# Patient Record
Sex: Female | Born: 1995
Health system: Southern US, Community
[De-identification: ages and names within clinical notes are randomized; demographics above are authoritative.]

## PROBLEM LIST (undated history)

## (undated) DIAGNOSIS — S53106A Unspecified dislocation of unspecified ulnohumeral joint, initial encounter: Secondary | ICD-10-CM

## (undated) DIAGNOSIS — Z91018 Allergy to other foods: Secondary | ICD-10-CM

## (undated) DIAGNOSIS — L309 Dermatitis, unspecified: Secondary | ICD-10-CM

## (undated) DIAGNOSIS — L500 Allergic urticaria: Secondary | ICD-10-CM

## (undated) HISTORY — DX: Unspecified dislocation of unspecified ulnohumeral joint, initial encounter: S53.106A

## (undated) HISTORY — DX: Dermatitis, unspecified: L30.9

## (undated) HISTORY — DX: Allergic urticaria: L50.0

## (undated) HISTORY — DX: Allergy to other foods: Z91.018

---
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
## 2017-08-31 ENCOUNTER — Telehealth: Payer: Self-pay | Admitting: Allergy and Immunology

## 2017-08-31 NOTE — Telephone Encounter (Signed)
Pt had appointment on 09/07/2017 but she wanted a skin test for seafood. You did a test a year ago and she was neg and she need to have a test because she had a reaction to shrimp or crab. Not sure which one so I put her down for 10/04/2017 at 8;30 for a challenge but which one you want her to have  [phone].

## 2017-08-31 NOTE — Telephone Encounter (Signed)
Skin test. Thanks.

## 2017-09-06 ENCOUNTER — Ambulatory Visit: Payer: Self-pay

## 2017-09-07 ENCOUNTER — Ambulatory Visit: Payer: Self-pay | Admitting: Allergy and Immunology

## 2017-09-08 ENCOUNTER — Ambulatory Visit (INDEPENDENT_AMBULATORY_CARE_PROVIDER_SITE_OTHER): Payer: BLUE CROSS/BLUE SHIELD | Admitting: Family Medicine

## 2017-09-08 ENCOUNTER — Encounter: Payer: Self-pay | Admitting: Family Medicine

## 2017-09-08 ENCOUNTER — Ambulatory Visit: Payer: Self-pay

## 2017-09-08 ENCOUNTER — Other Ambulatory Visit: Payer: Self-pay

## 2017-09-08 VITALS — BP 100/60 | HR 72 | Temp 98.7°F | Wt 145.0 lb

## 2017-09-08 DIAGNOSIS — Z30431 Encounter for routine checking of intrauterine contraceptive device: Secondary | ICD-10-CM

## 2017-09-08 DIAGNOSIS — IMO0001 Reserved for inherently not codable concepts without codable children: Secondary | ICD-10-CM

## 2017-09-08 NOTE — Progress Notes (Signed)
    Subjective:    Patient ID: Teresa Klein, female    DOB: 06/26/1995, 22 y.o.   MRN: [id number]   CC: IUD string check  HPI: reports she has not been seen in about a year and wants to make sure IUD is still in place. Denies seeing it come out. She denies pelvic pain. Does not get period on kyleena, just a few days of cramping every month. She's very pleased with it. Not currently sexually active, no concern for STDs. Going back to college tomorrow.   Smoking status reviewed- non-smoker  Review of Systems- see HPI   Objective:  BP 100/60   Pulse 72   Temp 98.7 F (37.1 C) (Oral)   Wt 145 lb (65.8 kg)   SpO2 98%   BMI 26.52 kg/m  Vitals and nursing note reviewed  General: well nourished, in no acute distress HEENT: normocephalic, MMM Cardiac: RRR, clear S1 and S2, no murmurs, rubs, or gallops Respiratory: clear to auscultation bilaterally, no increased work of breathing GU: normal external female genitalia, IUD strings visible from cervical os, cervix pink without lesions, no CMT or adnexal masses appreciated Extremities: no edema or cyanosis. Skin: warm and dry, no rashes noted Neuro: alert and oriented, no focal deficits   Assessment & Plan:   1. IUD check up Strings visualized in place  2. Contraception Kyleena placed May 2018, due to come out May 2023  3. Healthcare maintenance -due for tdap 2020 -due for HIV screening, patient defers at this time- not sexually active   Return in about 1 year (around 09/09/2018), or as needed.   Dolores PattyAngela Kortney Schoenfelder, DO Family Medicine Resident PGY-3

## 2017-09-08 NOTE — Patient Instructions (Signed)
It was great seeing you today. Enjoy your semester and if you have any health needs we're happy to see you any time.  If you have questions or concerns please do not hesitate to call at [phone].  Teresa Maine, DO PGY-3, [facility] 09/08/2017 2:02 PM    Preventive Care for Young Adults, Female The transition to life after high school as a young adult can be a stressful time with many changes. You may start seeing a primary care physician instead of a pediatrician. This is the time when your health care becomes your responsibility. Preventive care refers to lifestyle choices and visits with your health care provider that can promote health and wellness. What does preventive care include?  A yearly physical exam. This is also called an annual wellness visit.  Dental exams once or twice a year.  Routine eye exams. Ask your health care provider how often you should have your eyes checked.  Personal lifestyle choices, including: ? Daily care of your teeth and gums. ? Regular physical activity. ? Eating a healthy diet. ? Avoiding tobacco and drug use. ? Avoiding or limiting alcohol use. ? Practicing safe sex. ? Taking vitamin and mineral supplements as recommended by your health care provider. What happens during an annual wellness visit? Preventive care starts with a yearly visit to your primary care physician. The services and screenings done by your health care provider during your annual wellness visit will depend on your overall health, lifestyle risk factors, and family history of disease. Counseling Your health care provider may ask you questions about:  Past medical problems and your family's medical history.  Medicines or supplements you take.  Health insurance and access to health care.  Alcohol, tobacco, and drug use.  Your safety at home, work, or school.  Access to firearms.  Emotional well-being and how you cope with  stress.  Relationship well-being.  Diet, exercise, and sleep habits.  Your sexual health and activity.  Your methods of birth control.  Your menstrual cycle.  Your pregnancy history.  Screening You may have the following tests or measurements:  Height, weight, and BMI.  Blood pressure.  Lipid and cholesterol levels.  Tuberculosis skin test.  Skin exam.  Vision and hearing tests.  Screening test for hepatitis.  Screening tests for sexually transmitted diseases (STDs), if you are at risk.  BRCA-related cancer screening. This may be done if you have a family history of breast, ovarian, tubal, or peritoneal cancers.  Pelvic exam and Pap test. This may be done every 3 years starting at age 61.  Vaccines Your health care provider may recommend certain vaccines, such as:  Influenza vaccine. This is recommended every year.  Tetanus, diphtheria, and acellular pertussis (Tdap, Td) vaccine. You may need a Td booster every 10 years.  Varicella vaccine. You may need this if you have not been vaccinated.  HPV vaccine. If you are 65 or younger, you may need three doses over 6 months.  Measles, mumps, and rubella (MMR) vaccine. You may need at least one dose of MMR. You may also need a second dose.  Pneumococcal 13-valent conjugate (PCV13) vaccine. You may need this if you have certain conditions and were not previously vaccinated.  Pneumococcal polysaccharide (PPSV23) vaccine. You may need one or two doses if you smoke cigarettes or if you have certain conditions.  Meningococcal vaccine. One dose is recommended if you are age 43-21 years and a Market researcher living in a residence hall,  or if you have one of several medical conditions. You may also need additional booster doses.  Hepatitis A vaccine. You may need this if you have certain conditions or if you travel or work in places where you may be exposed to hepatitis A.  Hepatitis B vaccine. You may need this  if you have certain conditions or if you travel or work in places where you may be exposed to hepatitis B.  Haemophilus influenzae type b (Hib) vaccine. You may need this if you have certain risk factors.  Talk to your health care provider about which screenings and vaccines you need and how often you need them. What steps can I take to develop healthy behaviors?  Have regular preventive health care visits with your primary care physician and dentist.  Eat a healthy diet.  Drink enough fluid to keep your urine clear or pale yellow.  Stay active. Exercise at least 30 minutes 5 or more days of the week.  Use alcohol responsibly.  Maintain a healthy weight.  Do not use any products that contain nicotine, such as cigarettes, chewing tobacco, and e-cigarettes. If you need help quitting, ask your health care provider.  Do not use drugs.  Practice safe sex.  Use birth control (contraception) to prevent unwanted pregnancy. If you plan to become pregnant, see your health care provider for a pre-conception visit.  Find healthy ways to manage stress. How can I protect myself from injury? Injuries from violence or accidents are the leading cause of death among young adults and can often be prevented. Take these steps to help protect yourself:  Always wear your seat belt while driving or riding in a vehicle.  Do not drive if you have been drinking alcohol. Do not ride with someone who has been drinking.  Do not drive when you are tired or distracted. Do not text while driving.  Wear a helmet and other protective equipment during sports activities.  If you have firearms in your house, make sure you follow all gun safety procedures.  Seek help if you have been bullied, physically abused, or sexually abused.  Use the Internet responsibly to avoid dangers such as online bullying and online sexual predators.  What can I do to cope with stress? Young adults may face many new challenges  that can be stressful, such as finding a job, going to college, moving away from home, managing money, being in a relationship, getting married, and having children. To manage stress:  Avoid known stressful situations when you can.  Exercise regularly.  Find a stress-reducing activity that works best for you. Examples include meditation, yoga, listening to music, or reading.  Spend time in nature.  Keep a journal to write about your stress and how you respond.  Talk to your health care provider about stress. He or she may suggest counseling.  Spend time with supportive friends or family.  Do not cope with stress by: ? Drinking alcohol or using drugs. ? Smoking cigarettes. ? Eating.  Where can I get more information? Learn more about preventive care and healthy habits from:  [place] and Gynecologists: KaraokeExchange.nl  U.S. Probation officer Task Force: StageSync.si  National Adolescent and [place]: StrategicRoad.nl  American Academy of Pediatrics Bright Futures: https://brightfutures.MemberVerification.co.za  Society for Adolescent Health and Medicine: MoralBlog.co.za.aspx  PodExchange.nl: ToyLending.fr  This information is not intended to replace advice given to you by your health care provider. Make sure you discuss any questions you have with  your health care provider. Document Released: 05/30/2015 Document Revised: 06/20/2015 Document Reviewed: 05/30/2015 Elsevier Interactive Patient Education  [employer].

## 2017-10-04 ENCOUNTER — Encounter: Payer: Self-pay | Admitting: Allergy and Immunology

## 2017-10-04 ENCOUNTER — Ambulatory Visit: Payer: Self-pay | Admitting: Allergy and Immunology

## 2017-11-29 ENCOUNTER — Encounter: Payer: Medicaid Other | Admitting: Family Medicine

## 2017-12-07 ENCOUNTER — Encounter: Payer: Self-pay | Admitting: Family Medicine

## 2017-12-07 ENCOUNTER — Ambulatory Visit (INDEPENDENT_AMBULATORY_CARE_PROVIDER_SITE_OTHER): Payer: BLUE CROSS/BLUE SHIELD | Admitting: Family Medicine

## 2017-12-07 ENCOUNTER — Other Ambulatory Visit: Payer: Self-pay

## 2017-12-07 VITALS — BP 100/58 | HR 74 | Temp 99.0°F | Ht 62.0 in | Wt 147.8 lb

## 2017-12-07 DIAGNOSIS — Z0000 Encounter for general adult medical examination without abnormal findings: Secondary | ICD-10-CM | POA: Diagnosis not present

## 2017-12-07 DIAGNOSIS — J301 Allergic rhinitis due to pollen: Secondary | ICD-10-CM

## 2017-12-07 MED ORDER — CETIRIZINE HCL 10 MG PO TABS: 10.0000 mg | ORAL_TABLET | Freq: Every day | ORAL | 11 refills | Status: AC

## 2017-12-07 MED ORDER — FLUTICASONE PROPIONATE 50 MCG/ACT NA SUSP
2.0000 | Freq: Every day | NASAL | 3 refills | Status: AC
Start: 2017-12-07 — End: 2018-01-06

## 2017-12-07 NOTE — Progress Notes (Signed)
Teresa Klein is a 22 y.o. female who presents to [facility] today for a comprehensive physical examination:  CPE  Concerns:  None Traveling to [place] this semester and needs a physical form completed.  Has history of allergies to shellfish -- she is going for allergy testing in December.  Otherwise no concerns.   Last physical last year Tetanus up to date Flu vaccine yes Eye exam:  n/a Dental exam every six months.   PMH reviewed. Patient is a nonsmoker.   Past Medical History:  Diagnosis Date  . Closed unspecified dislocation of elbow 01/13/2010   Qualifier: Diagnosis of  By: Jennette Kettle MD, [place] Dec    . Eczema   . Urticaria 05/18/2016   Past Surgical History:  Procedure Laterality Date  . WISDOM TOOTH EXTRACTION  2018    Medications reviewed. Current Outpatient Medications  Medication Sig Dispense Refill  . cetirizine (ZYRTEC) 10 MG tablet Take 1 tablet (10 mg total) by mouth daily. 30 tablet 11  . fluticasone (FLONASE) 50 MCG/ACT nasal spray Place 2 sprays into both nostrils daily. 16 g 3   No current facility-administered medications for this visit.     Social: Smoking history:  denies Alcohol use:  occasional Illicit drug use:  denies Relationship status:   single Occupation:  Consulting civil engineer at [employer]  Family History:  Father with HTN, HLD, DM.  Mom with allergic rhinitis.    Review of Systems  Constitutional: Negative for fever.  HENT: Negative for congestion, ear discharge, ear pain and hearing loss.   Eyes: Negative for blurred vision.  Respiratory: Negative for cough and wheezing.   Cardiovascular: Negative for chest pain, palpitations and leg swelling.  Gastrointestinal: Negative for nausea, vomiting and abdominal pain.  Genitourinary: Negative for dysuria, hematuria and flank pain.  Musculoskeletal: Negative for neck pain.  Skin: Negative for rash.  Neurological: Negative for dizziness and headaches.  Psychiatric/Behavioral: Negative for depression  and suicidal ideas.   Exam: BP (!) 100/58   Pulse 74   Temp 99 F (37.2 C) (Oral)   Ht 5\' 2"  (1.575 m)   Wt 147 lb 12.8 oz (67 kg)   SpO2 99%   BMI 27.03 kg/m  Gen:  Alert, cooperative patient who appears stated age in no acute distress.  Vital signs reviewed. Head: Delta/AT.   Eyes:  EOMI, PERRL.   Ears:  External ears WNL, Bilateral TM's normal without retraction, redness or bulging. Nose:  Septum midline  Mouth:  MMM, tonsils non-erythematous, non-edematous.   Neck: No masses or thyromegaly or limitation in range of motion.  No cervical lymphadenopathy. Pulm:  Clear to auscultation bilaterally with good air movement.  No wheezes or rales noted.   Cardiac:  Regular rate and rhythm without murmur auscultated.  Good S1/S2. Abd:  Soft/nondistended/nontender.  Good bowel sounds throughout all four quadrants.  No masses noted.  Ext:  No clubbing/cyanosis/erythema.  No edema noted bilateral lower extremities.   Neuro:  Grossly normal, no gait abnormalities Psych:  Not depressed or anxious appearing.  Conversant and engaged  Impression/Plan: 1. Complete Physical Examination: anticipatory guidance provided.  S/p TDAP.   2.  Allergies:  Continue cetirizine and flonase for relief.  Keep appt with allergiest. 3.  No concerns by history or exam.  I completed her paperwork for study abroad.

## 2017-12-07 NOTE — Patient Instructions (Signed)
It was good to meet you today.  Best of luck in [place]!

## 2017-12-14 ENCOUNTER — Telehealth: Payer: Self-pay

## 2017-12-14 NOTE — Telephone Encounter (Signed)
She last had a reaction on memorial day of 2019. On that day she was eating crab and shrimp. She has not consumed it since.

## 2017-12-14 NOTE — Telephone Encounter (Signed)
Spoke to patient advised as written per Dr Nunzio CobbsBobbitt explained to patient a skin test is needed first and possible labwork. Patient verbalized understanding. appt scheduled for skin testing 12/22/17 @ 9:30 am

## 2017-12-14 NOTE — Telephone Encounter (Signed)
Last year she had borderline positive skin tests to shellfish but had stated that she ate shellfish on a regular basis without problems, so it was considered false positive results. Has she had problems with shellfish since that time?

## 2017-12-14 NOTE — Telephone Encounter (Signed)
Called patient left message to return call

## 2017-12-14 NOTE — Telephone Encounter (Signed)
Ok, then we definitely need to start with skin testing. Thanks.

## 2017-12-14 NOTE — Telephone Encounter (Signed)
Hey patient was down for a shrimp challenge, but I see in the previous telephone contact that you stated to do the allergy test. I am wondering which should I do. I think the patient is under the impression that it will be a challenge. She is going out of the country in January with her school and she needs to know if she can eat shrimp or not.  Thanks

## 2017-12-22 ENCOUNTER — Ambulatory Visit: Payer: BLUE CROSS/BLUE SHIELD | Admitting: Family Medicine

## 2017-12-22 ENCOUNTER — Encounter: Payer: Self-pay | Admitting: Family Medicine

## 2017-12-22 VITALS — BP 86/60 | HR 64 | Temp 98.3°F | Resp 16 | Ht 62.0 in | Wt 145.7 lb

## 2017-12-22 DIAGNOSIS — T7800XD Anaphylactic reaction due to unspecified food, subsequent encounter: Secondary | ICD-10-CM | POA: Diagnosis not present

## 2017-12-22 DIAGNOSIS — J3089 Other allergic rhinitis: Secondary | ICD-10-CM

## 2017-12-22 DIAGNOSIS — H1013 Acute atopic conjunctivitis, bilateral: Secondary | ICD-10-CM

## 2017-12-22 MED ORDER — EPINEPHRINE 0.3 MG/0.3ML IJ SOAJ: INTRAMUSCULAR | 2 refills | Status: AC

## 2017-12-22 MED ORDER — OLOPATADINE HCL 0.2 % OP SOLN: OPHTHALMIC | 5 refills | Status: AC

## 2017-12-22 NOTE — Patient Instructions (Addendum)
Food allergy Your skin testing today was positive to shellfish mix, shrimp, carb, and lobster Continue to avoid all shellfish. In case of an allergic reaction, take Benadryl  50 mg every 4  hours, and if life-threatening symptoms occur, inject with AuviQ 0.3 mg.  Perennial and seasonal allergic rhinitis Continue cetirizine 10 mg once a day as needed for a runny nose Continue Flonase 1-2 sprays in each nostril once a day as needed for a stuffy nose Consider nasal saline rinses as needed for nasal symptoms  Allergic conjunctivitis of both eyes Pataday eye drops one drop in each eye once a day as needed for red or itchy eyes  Call us if this treatment plan is not working well for you  Follow up in 4 months or sooner if needed

## 2017-12-22 NOTE — Progress Notes (Addendum)
[address] [place] [id number] Dept: [phone]  FOLLOW UP NOTE  Patient ID: Teresa Klein, female    DOB: 1995/03/14  Age: 22 y.o. MRN: [id number] Date of Office Visit: 12/22/2017  Assessment  Chief Complaint: Allergy Testing  HPI Teresa Klein is a 22 year old female who presents to the clinic for a follow up visit. She was last seen in this office on 05/18/2016 by Dr. Nunzio CobbsBobbitt for evaluation of allergic rhinitis, allergic conjunctivitis, urticaria, and food allergies. At that time percutaneous skin testing was equivocal to shellfish mix and shrimp. However, he had stated at that time that she consumed shellfish on a regular basis without symptoms, therefore the skin test was considered to be a false positive result.  At today's visit, she reports that in January 2019 she experienced mouth and throat itching, eye swelling, dizziness, and hand swelling and itching immediately after eating shrimp. She took Benadryl and her symptoms were gone in the morning when she woke up. She experienced the same symptoms [place] [facility] [place] [facility] which were resolved with Benadryl by the next morning. She has not experienced swelling or hives other than when she had eaten shellfish since her last visit to this office. She is currently avoiding shellfish. Allergic rhinitis is reported as well controlled with cetirizine and Flonase as needed. Her current medications are listed in the chart.   Drug Allergies:  Allergies  Allergen Reactions  . Other     pitaya causes facial swelling  . Shrimp [Shellfish Allergy]     Facial,hand,and toes swelling. Itchy ears and eyes    Physical Exam: BP (!) 86/60 (BP Location: Left Arm, Patient Position: Sitting, Cuff Size: Normal)   Pulse 64   Temp 98.3 F (36.8 C) (Oral)   Resp 16   Ht 5\' 2"  (1.575 m)   Wt 145 lb 11.6 oz (66.1 kg)   SpO2 99%   BMI 26.65 kg/m    Physical Exam  Constitutional: She is oriented to person, place,  and time. She appears well-developed and well-nourished.  HENT:  Head: Normocephalic.  Right Ear: External ear normal.  Left Ear: External ear normal.  Nose: Nose normal.  Mouth/Throat: Oropharynx is clear and moist.  Bilateral nares normal. Pharynx normal. Ears normal. Eyes normal.  Eyes: Conjunctivae are normal.  Neck: Normal range of motion. Neck supple.  Cardiovascular: Normal rate, regular rhythm and normal heart sounds.  No murmur noted  Pulmonary/Chest: Effort normal and breath sounds normal.  Lungs clear to auscultation  Musculoskeletal: Normal range of motion.  Neurological: She is alert and oriented to person, place, and time.  Skin: Skin is warm and dry.  Psychiatric: She has a normal mood and affect. Her behavior is normal. Judgment and thought content normal.  Vitals reviewed.   Diagnostics: Percutaneous skin testing positive to shellfish mix, shrimp, crab, and lobster and negative to oyster and scallop with adequate controls.   Assessment and Plan: 1. Anaphylactic shock due to food, subsequent encounter   2. Perennial and seasonal allergic rhinitis   3. Allergic conjunctivitis of both eyes     Meds ordered this encounter  Medications  . Olopatadine HCl (PATADAY) 0.2 % SOLN    Sig: One drop each eye once a day as needed for itchy eyes.    Dispense:  2.5 mL    Refill:  5  . EPINEPHrine (AUVI-Q) 0.3 mg/0.3 mL IJ SOAJ injection    Sig: Use as directed for severe allergic  reaction.    Dispense:  4 Device    Refill:  2    Dispense 2 cartons of 2 devices each.    Patient Instructions  Food allergy The patient's history suggests shellfish allergy and positive skin test results today confirm this diagnosis.  Meticulous avoidance of shellfish as discussed.  A prescription has been provided for epinephrine auto-injector 2 pack along with instructions for proper administration.  A food allergy action plan has been provided and discussed.  Medic Alert  identification is recommended.  Perennial and seasonal allergic rhinitis Continue cetirizine 10 mg once a day as needed for a runny nose Continue Flonase 1-2 sprays in each nostril once a day as needed for a stuffy nose Consider nasal saline rinses as needed for nasal symptoms  Allergic conjunctivitis of both eyes Pataday eye drops one drop in each eye once a day as needed for red or itchy eyes  Call us if this treatment plan is not working well for you  Follow up in 4 months or sooner if needed   Return in about 4 months (around 04/22/2018).    Thank you for the opportunity to care for this patient.  Please do not hesitate to contact me with questions.  Thermon Leyland, FNP [facility]  _________________________________________________  I have provided oversight concerning [facility]'s evaluation and treatment of this patient's health issues addressed during today's encounter.  I agree with the assessment and therapeutic plan as outlined in the note.   Signed,   R Jorene Guest, MD

## 2018-01-10 ENCOUNTER — Ambulatory Visit: Payer: Medicaid Other | Admitting: Allergy and Immunology

## 2018-01-12 ENCOUNTER — Encounter: Payer: Medicaid Other | Admitting: Family Medicine

## 2018-01-12 ENCOUNTER — Ambulatory Visit (INDEPENDENT_AMBULATORY_CARE_PROVIDER_SITE_OTHER): Payer: BLUE CROSS/BLUE SHIELD

## 2018-01-12 DIAGNOSIS — Z111 Encounter for screening for respiratory tuberculosis: Secondary | ICD-10-CM

## 2018-01-12 NOTE — Progress Notes (Signed)
   Tuberculin skin test applied to right ventral forearm. Appointment made for 01/14/18 for PPD reading.  Ples SpecterAlisa Rafaelita Foister, RN [facility] RN)

## 2018-01-14 ENCOUNTER — Ambulatory Visit (INDEPENDENT_AMBULATORY_CARE_PROVIDER_SITE_OTHER): Payer: BLUE CROSS/BLUE SHIELD

## 2018-01-14 DIAGNOSIS — Z111 Encounter for screening for respiratory tuberculosis: Secondary | ICD-10-CM

## 2018-01-14 LAB — TB SKIN TEST
INDURATION: 0 mm
TB Skin Test: NEGATIVE

## 2018-01-14 NOTE — Progress Notes (Signed)
   Patient here today to have PPD site read.   PPD read and results entered in Epic. Result: 0 mm induration. Interpretation: Negative Letter given for employer.  Alisa Brake, RN ([facility] RN) 

## 2019-02-03 ENCOUNTER — Ambulatory Visit: Payer: 59 | Attending: Internal Medicine

## 2019-02-03 DIAGNOSIS — Z20822 Contact with and (suspected) exposure to covid-19: Secondary | ICD-10-CM

## 2019-02-04 LAB — NOVEL CORONAVIRUS, NAA: SARS-CoV-2, NAA: NOT DETECTED

## 2019-02-20 ENCOUNTER — Ambulatory Visit: Payer: 59 | Admitting: Family Medicine

## 2019-03-17 ENCOUNTER — Other Ambulatory Visit: Payer: Self-pay

## 2019-03-17 ENCOUNTER — Ambulatory Visit: Payer: 59 | Admitting: Family Medicine

## 2019-03-31 ENCOUNTER — Other Ambulatory Visit (HOSPITAL_COMMUNITY)
Admission: RE | Admit: 2019-03-31 | Discharge: 2019-03-31 | Disposition: A | Discharge status: Home / Self Care | Payer: 59 | Source: Ambulatory Visit | Attending: Family Medicine | Admitting: Family Medicine

## 2019-03-31 ENCOUNTER — Other Ambulatory Visit: Payer: Self-pay

## 2019-03-31 ENCOUNTER — Ambulatory Visit (INDEPENDENT_AMBULATORY_CARE_PROVIDER_SITE_OTHER): Payer: 59 | Admitting: Family Medicine

## 2019-03-31 ENCOUNTER — Encounter: Payer: Self-pay | Admitting: Family Medicine

## 2019-03-31 VITALS — BP 98/72 | HR 64 | Wt 136.4 lb

## 2019-03-31 DIAGNOSIS — Z0000 Encounter for general adult medical examination without abnormal findings: Secondary | ICD-10-CM | POA: Insufficient documentation

## 2019-03-31 NOTE — Progress Notes (Signed)
    SUBJECTIVE:   CHIEF COMPLAINT / HPI:   Ms. Teresa Klein is a 24 yo female presenting today to reestablish care and for an annual check up.  She does not have any concerns today. She was seen in our clinic a couple of years ago and change of insurance has brought her back to our office "where she belongs". She is studying at [employer]	[place] with a pre-med focus. 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 Her mother is a Research officer, trade union for [employer].  She is not currently sexually active but has been in the past with one female partner. She has an IUD place 06/23/2016 and is very happy with it. She initially had regular periods that have since become lighter and intermittent. She does not want STI testing at this time. She does not ingestion alcohol and has never used tobacco or illicit drugs.    She does not endorse vaginal irritation, dysuria, or changes in bowels.  PERTINENT  PMH / PSH: Allergic rhinitis, GERD, functional bowel disease, Acne, GAD, IUD.   OBJECTIVE:   Wt 136 lb 6.4 oz (61.9 kg)   BMI 24.95 kg/m     Office Visit from 03/31/2019 in [facility]  PHQ-2 Total Score  0     General: Appears well, no acute distress. Age appropriate. Cardiac: RRR, normal heart sounds, no murmurs Respiratory: CTAB, normal effort Abdomen: soft, nontender, non distended, +BS Pelvic: cervix normal in appearance, external genitalia normal, no adnexal masses or tenderness, no cervical motion tenderness, uterus normal size, shape, and consistency and vagina normal without discharge Extremities: No edema or cyanosis. Skin: Warm and dry, no rashes noted Psych: normal affect  ASSESSMENT/PLAN:   Annual physical exam Reestablishing care. Was last seen in our clinic 1 year ago. No concerns today. Has been sexually active in the past with 1 female partner using condoms. Does not want STI testing but amendable to 1x HIV  screening. Desires Pap today.  -Pap smear -HIV antibody test -Consider STI testing at future visits -Follow up in 1-2 years or sooner if needed  Lavonda Jumbo, DO [facility]

## 2019-03-31 NOTE — Patient Instructions (Addendum)
It was very nice to meet you today. Please enjoy the rest of your week. Good luck in school, study hard!   We will call you with your results.  Return in 1-2 years for annual wellness exam or sooner if needed.  Please call the clinic at [phone] if you have any concerns. It was our pleasure to serve you.

## 2019-03-31 NOTE — Assessment & Plan Note (Addendum)
Reestablishing care. Was last seen in our clinic 1 year ago. No concerns today. Has been sexually active in the past with 1 female partner using condoms. Does not want STI testing but amendable to 1x HIV screening. Desires Pap today.  -Pap smear -HIV antibody test -Consider STI testing at future visits -Follow up in 1-2 years or sooner if needed

## 2019-04-01 LAB — HIV ANTIBODY (ROUTINE TESTING W REFLEX): HIV Screen 4th Generation wRfx: NONREACTIVE

## 2019-04-03 LAB — CYTOLOGY - PAP: Diagnosis: NEGATIVE

## 2019-08-21 ENCOUNTER — Encounter: Payer: Self-pay | Admitting: Family Medicine

## 2019-08-21 ENCOUNTER — Other Ambulatory Visit: Payer: Self-pay

## 2019-08-21 ENCOUNTER — Ambulatory Visit: Payer: 59 | Admitting: Family Medicine

## 2019-08-21 VITALS — BP 94/60 | HR 60 | Ht 62.0 in | Wt 127.0 lb

## 2019-08-21 DIAGNOSIS — N941 Unspecified dyspareunia: Secondary | ICD-10-CM

## 2019-08-21 DIAGNOSIS — Z1329 Encounter for screening for other suspected endocrine disorder: Secondary | ICD-10-CM

## 2019-08-21 DIAGNOSIS — B9689 Other specified bacterial agents as the cause of diseases classified elsewhere: Secondary | ICD-10-CM

## 2019-08-21 DIAGNOSIS — Z130 Encounter for screening for diseases of the blood and blood-forming organs and certain disorders involving the immune mechanism: Secondary | ICD-10-CM

## 2019-08-21 DIAGNOSIS — L089 Local infection of the skin and subcutaneous tissue, unspecified: Secondary | ICD-10-CM | POA: Diagnosis not present

## 2019-08-21 DIAGNOSIS — F0630 Mood disorder due to known physiological condition, unspecified: Secondary | ICD-10-CM

## 2019-08-21 LAB — POCT WET PREP (WET MOUNT)
Clue Cells Wet Prep Whiff POC: NEGATIVE
Trichomonas Wet Prep HPF POC: ABSENT

## 2019-08-21 MED ORDER — MUPIROCIN 2 % EX OINT: 1.0000 "application " | TOPICAL_OINTMENT | Freq: Two times a day (BID) | CUTANEOUS | 0 refills | Status: AC

## 2019-08-21 NOTE — Assessment & Plan Note (Signed)
Patient has h/o GAD and MDD, currently being treated by psychiatrist with escitalopram and recently decreasing dosage, likely unmasking development of affective and physical symptoms of pre-menstrual syndrome or possibly PMDD. Patient feels very low mood, notices mood swings, bloating, intense fatigue, very similar to her previous depressive symptoms that start the week before her period and completely resolve by day 2 of her period for the last 2 months. To diagnose with PMS or PMDD (likely PMDD due to intensity of affective sx), she will need to keep a prospective symptom diary for 2 months. Discussed that this is likely also revealed now as she is in the age to develop it, and is recently decreasing SSRI. No SI/HI, no h/o attempts. Discussed safety planning if those develop. She is meeting with her psychiatrist next week to discuss options, but she does not currently want to switch to another SSRI, especially if she only has these sx for one week a month. Recommended shared decision making with her psychiatrist, but will check thyroid and for anemia as she has not had CBC in 7+ years. Patient in agreement with this plan. - TSH, fT4, CBC

## 2019-08-21 NOTE — Patient Instructions (Signed)
It was a pleasure to see you!  For your IUD concerns, it was correctly in place. I will let you know if any abnormal results return on the labs we did today. I recommend you discuss your medication options with your psychiatrist and keep a symptom diary, as these symptoms are likely related to decreasing your medication.   For your chin, please use bactroban (mupirocin ointment) twice a day for up to two weeks, or you may stop earlier if improves before then. Most likely this is a superficial infection, but could be caused by a herpes virus like a cold sore. If this occurs again and you notice it earlier, we could try another medication called acyclovir, so call the office and I can give you a prescription.  Be Well!  Dr. Leary Roca

## 2019-08-21 NOTE — Progress Notes (Signed)
SUBJECTIVE:   CHIEF COMPLAINT / HPI:  Concern for IUD dislodgement, mood/fatigue changes with period, new bumps on chin  Concerns for IUD: patient had 1 episode of pain with intercourse, it was a sharp pain that occurred during intercourse and immediately resolved. No bleeding, no change in discharge, no itching, dysuria, urinary frequency, etc. She is not concerned for STIs (prefers not to be tested at this time), this is a monogamous partner she has been with for 5+ months. Noticed pain with deep penetration, was concerned that her IUD was dislodged and wishes to be checked.   Mood changes with period: Patient has had Kyleena since 05/2016 and has been very happy with it. She has a period 1x per month regularly, and has never had symptoms of low mood, fatigue, prior to her period for the last 10 years. For the past 2 months she has noticed that the week leading up to her period she has had  Intense mood swings, feeling down, very fatigued to the point of needing naps, etc, which is unusual for her. By day 2 of her period, these symptoms resolve. She reports no change in her stress level, if anything it is improved over the summer. She is happy in her relationships. She was diagnosed with GAD, MDD in 2015 and had been previously treated with Paxil x 1.5 years and with CBT and was doing quite well until the pandemic. In March 2020 she was in [place], [place], for study abroad and she was very sad to be sent home and noticed return of anxiety and deppressive symptoms with quarantining. She started Escitalopram in December, 2020, and was doing well on it until about 2 months ago when she started having intense dreams. Since she was feeling better overall and did not like the dreams, she has opted with her psychiatrist to decrease escitalopram dosage in the last ~3 months from 20 mg daily to now 10 mg daily (was at 15mg  in the interim). She does not want to switch to a different SSRI if she can avoid it. She  denies SI/HI and has no history of attempts.  Lesion on chin: patient noticed a pimple-like lesion on her chin last Monday (7/19) that subsequently had some tingling and was pruritic. She takes cetirizine daily and this did not help. She then thought it may have been a bug bite or a cold sore. It subsequently started to have several more lesions in the immediate vicinity that had some serous (clear, yellow, no pus) drainage. Ice helps the itching. She has tried abreva. She reports a similar occurrence about 8 years ago. No other history of cold sores or herpes that she knows of. She did not have chicken pox, but got varicella vaccine.  PERTINENT  PMH / PSH: GAD, MDD  OBJECTIVE:   BP (!) 94/60   Pulse 60   Ht 5\' 2"  (1.575 m)   Wt 127 lb (57.6 kg)   SpO2 99%   BMI 23.23 kg/m   Physical Exam Vitals and nursing note reviewed. Exam conducted with a chaperone present.  Constitutional:      General: She is not in acute distress.    Appearance: Normal appearance. She is normal weight. She is not ill-appearing, toxic-appearing or diaphoretic.  HENT:     Head: Normocephalic and atraumatic.     Mouth/Throat:     Mouth: Mucous membranes are moist.     Pharynx: Oropharynx is clear. No oropharyngeal exudate or posterior oropharyngeal erythema.  Genitourinary:  Comments: PELVIC:  Normal appearing external female genitalia, normal vaginal epithelium, no abnormal discharge. Normal appearing cervix, IUD strings in place.  Ultrasound: shows IUD in place in uterus. Skin:    General: Skin is warm and dry.     Findings: Lesion present.     Comments: Eleven 12mm crusts in confluent distribution on left side of chin, mildly erythematous, no pustulant drainage.  Neurological:     General: No focal deficit present.     Mental Status: She is alert. Mental status is at baseline.  Psychiatric:        Mood and Affect: Mood normal.        Behavior: Behavior normal.    Depression screen [facility] 2/9 08/21/2019  08/21/2019 03/31/2019 12/07/2017 09/08/2017  Decreased Interest 1 1 0 0 0  Down, Depressed, Hopeless 1 1 0 0 0  PHQ - 2 Score 2 2 0 0 0  Altered sleeping 2 1 - - -  Tired, decreased energy 3 2 - - -  Change in appetite 1 2 - - -  Feeling bad or failure about yourself  0 0 - - -  Trouble concentrating 0 0 - - -  Moving slowly or fidgety/restless 0 0 - - -  Suicidal thoughts 0 0 - - -  PHQ-9 Score 8 7 - - -  Difficult doing work/chores - Somewhat difficult - - -     ASSESSMENT/PLAN:   Superficial bacterial infection of skin 1 week of pruritic lesions on chin, without prodrome, reports 1 similar occurrence 8 years ago (unclear if truly related). Crusts appear to be resolving with some erythema related to inflammation and likely superficial infection of the skin, primary vs. Secondary unclear. Ddx includes herpes gladiatorum, acne, insect bite, shingles unlikely due to no h/o chicken pox, vaccinated, and not painful. Expect it will continue to resolve, unclear if it was originally acne, insect bite, bacterial (she does attend a gym), or viral. If it occurs again, recommend she call/get appointment and take a photo earlier. Could perhaps try acyclovir to assess if improved with this if recurs.  - treat with bacitracin oint BID x 2 weeks  Dyspareunia in female One occurrence during penetration in intercourse that immediately resolves, low indication for further imaging. Most likely due to positioning with deep penetration. Patient uses lubrication, does not wish to have STI testing; patient worried about possible dislodgement of IUD. Confirmed IUD in place visually with strings in cervix on pelvic exam, in place in uterus on [place]. Physical exam without abnormalities. Gave reassurance. - wet prep WNL  Menstrual-related mood disorder Patient has h/o GAD and MDD, currently being treated by psychiatrist with escitalopram and recently decreasing dosage, likely unmasking development of affective and  physical symptoms of pre-menstrual syndrome or possibly PMDD. Patient feels very low mood, notices mood swings, bloating, intense fatigue, very similar to her previous depressive symptoms that start the week before her period and completely resolve by day 2 of her period for the last 2 months. To diagnose with PMS or PMDD (likely PMDD due to intensity of affective sx), she will need to keep a prospective symptom diary for 2 months. Discussed that this is likely also revealed now as she is in the age to develop it, and is recently decreasing SSRI. No SI/HI, no h/o attempts. Discussed safety planning if those develop. She is meeting with her psychiatrist next week to discuss options, but she does not currently want to switch to another SSRI, especially if she only  has these sx for one week a month. Recommended shared decision making with her psychiatrist, but will check thyroid and for anemia as she has not had CBC in 7+ years. Patient in agreement with this plan. - TSH, fT4, CBC     Shirlean Mylar, MD [facility]

## 2019-08-21 NOTE — Assessment & Plan Note (Signed)
1 week of pruritic lesions on chin, without prodrome, reports 1 similar occurrence 8 years ago (unclear if truly related). Crusts appear to be resolving with some erythema related to inflammation and likely superficial infection of the skin, primary vs. Secondary unclear. Ddx includes herpes gladiatorum, acne, insect bite, shingles unlikely due to no h/o chicken pox, vaccinated, and not painful. Expect it will continue to resolve, unclear if it was originally acne, insect bite, bacterial (she does attend a gym), or viral. If it occurs again, recommend she call/get appointment and take a photo earlier. Could perhaps try acyclovir to assess if improved with this if recurs.  - treat with bacitracin oint BID x 2 weeks

## 2019-08-21 NOTE — Assessment & Plan Note (Signed)
One occurrence during penetration in intercourse that immediately resolves, low indication for further imaging. Most likely due to positioning with deep penetration. Patient uses lubrication, does not wish to have STI testing; patient worried about possible dislodgement of IUD. Confirmed IUD in place visually with strings in cervix on pelvic exam, in place in uterus on [place]. Physical exam without abnormalities. Gave reassurance. - wet prep WNL

## 2019-08-22 ENCOUNTER — Encounter: Payer: Self-pay | Admitting: Family Medicine

## 2019-08-22 ENCOUNTER — Ambulatory Visit: Payer: 59 | Admitting: Family Medicine

## 2019-08-22 LAB — CBC
Hematocrit: 43.3 % (ref 34.0–46.6)
Hemoglobin: 14.9 g/dL (ref 11.1–15.9)
MCH: 31.4 pg (ref 26.6–33.0)
MCHC: 34.4 g/dL (ref 31.5–35.7)
MCV: 91 fL (ref 79–97)
Platelets: 202 10*3/uL (ref 150–450)
RBC: 4.75 x10E6/uL (ref 3.77–5.28)
RDW: 11.8 % (ref 11.7–15.4)
WBC: 5.3 10*3/uL (ref 3.4–10.8)

## 2019-08-22 LAB — T4, FREE: Free T4: 1.13 ng/dL (ref 0.82–1.77)

## 2019-08-22 LAB — TSH: TSH: 2.92 u[IU]/mL (ref 0.450–4.500)

## 2019-09-06 ENCOUNTER — Other Ambulatory Visit: Payer: Self-pay

## 2019-09-06 ENCOUNTER — Encounter: Payer: Self-pay | Admitting: Family Medicine

## 2019-09-06 ENCOUNTER — Ambulatory Visit: Payer: 59 | Admitting: Family Medicine

## 2019-09-06 VITALS — BP 92/58 | HR 76 | Ht 62.0 in | Wt 129.6 lb

## 2019-09-06 DIAGNOSIS — R5383 Other fatigue: Secondary | ICD-10-CM

## 2019-09-06 LAB — POCT URINE PREGNANCY: Preg Test, Ur: NEGATIVE

## 2019-09-06 NOTE — Patient Instructions (Signed)
It was a pleasure to see you today!  1. We did some lab work to look for other causes of fatigue. I will let you know of any abnormal results.  2. Keep up the good work with exercising and eating healthily. Eat enough calories to cover your work outs, too.  3. Follow up with your psychiatrist.  Bonita Quin may follow up with me as needed.  Be Well,   Dr. Leary Roca

## 2019-09-07 LAB — BASIC METABOLIC PANEL
BUN/Creatinine Ratio: 25 — ABNORMAL HIGH (ref 9–23)
BUN: 17 mg/dL (ref 6–20)
CO2: 23 mmol/L (ref 20–29)
Calcium: 9.3 mg/dL (ref 8.7–10.2)
Chloride: 103 mmol/L (ref 96–106)
Creatinine, Ser: 0.69 mg/dL (ref 0.57–1.00)
GFR calc Af Amer: 144 mL/min/{1.73_m2} (ref >59)
GFR calc non Af Amer: 125 mL/min/{1.73_m2} (ref >59)
Glucose: 83 mg/dL (ref 65–99)
Potassium: 4.8 mmol/L (ref 3.5–5.2)
Sodium: 138 mmol/L (ref 134–144)

## 2019-09-07 LAB — RHEUMATOID FACTOR: Rheumatoid fact SerPl-aCnc: <10 IU/mL (ref 0.0–13.9)

## 2019-09-07 LAB — ANA: Anti Nuclear Antibody (ANA): NEGATIVE

## 2019-09-10 ENCOUNTER — Encounter: Payer: Self-pay | Admitting: Family Medicine

## 2019-09-10 DIAGNOSIS — R5383 Other fatigue: Secondary | ICD-10-CM | POA: Insufficient documentation

## 2019-09-10 NOTE — Assessment & Plan Note (Addendum)
Patient reports fatigue, denies any syncope, dizziness, lightheadedness. Cannot take part in activities she enjoys like weight lifting, exercise. Previously checked thyroid panels in the last month and CBC, both WNL. Possibly could be related to decreased SSRI, however this has been appropriately decreased over several months. Fatigue no longer lack of motivation, which is more consistent with depression. Recommend patient follow up with her psychiatrist, appt on 09/21/19. It is possible that patient has functional disorder of fatigue, where despite her best efforts, is not able to achieve her goals. Discussed with patient changing expectations to fit her ability, as her ability to do more may not change soon. Can consider CBT. Obtained BMP to assess electrolytes, and ANA, RF to r/o autoimmune disorders. All three WNL, no objective markers of autoimmune problem. Will discuss with patient and recommend CBT and follow up as needed. Recommend patient increase calories daily to cover workouts to meet ~1500 calories per day. Does not need to be in caloric deficit. She denies signs/symptoms of ED, happy with body image at this point in time.

## 2019-09-10 NOTE — Progress Notes (Signed)
SUBJECTIVE:   CHIEF COMPLAINT / HPI: fatigue f/u  Patient reports that she is still fatigued, sometimes having to take naps in the afternoon, noticing that she cannot do normal activities that she previously did such as walking 10,000 steps per day. She enjoys lifting weights and has had to limit her gym time, weights, and sometimes has to cancel these exercises all together. She does not have too much trouble falling asleep and practices sleep hygiene: 1 hour before bed she turns on dim twinkle lights, plays gentle music, uses blue light glasses, etc. Sleeps on average 7 hours per night. Sometimes awakes in the night, but falls back to sleep. She reports that in the last year she has intentionally lost weight, about 20+/- lbs, from 140s down to 120s. She eats 1300-1700 calories per day, not accounting for the 250-400 calories she burns with daily exercise. She charts her macronutrients as well and eats 60+g protein daily, ~150g carbs daily. She is concerned that restarting school next week that she will be too tired to exercise and keep up with her classes. She confirms that she has had depression and started lexapro last December which helped her mood. Unfortunately, she started having intensely vivid nightmares that were disturbing and disrupted her sleep and equilibrium during the day. Consequently, in consultation with her psychiatrist, she started slowly decreasing lexapro dose, from 20mg  qd to 15 mg from March to May, and from May to now has been at 10mg  qd. Her next appointment with her psychiatrist is 09/21/19. She also reports that her fatigue now is not the same as her fatigue that she experienced with depression. For example, when depressed, she did not have motivation to exercise or participate in any activity. Now, she wants to participate in exercise and social events, but does not have the energy to do so. She is concerned that she has hypoglycemia which is contributing to her fatigue,  however denies shaking, nausea, seizures, vomiting, etc.  Her period is due next week and she reports good mood and no symptoms of PMS/PMDD such as irritability, emotional lability, etc, that she had experienced for the prior two months.  PERTINENT  PMH / PSH: MDD, GAD, functional bowel disease  OBJECTIVE:   BP (!) 92/58    Pulse 76    Ht 5\' 2"  (1.575 m)    Wt 129 lb 9.6 oz (58.8 kg)    SpO2 98%    BMI 23.70 kg/m   Physical Exam Vitals and nursing note reviewed.  Constitutional:      General: She is not in acute distress.    Appearance: Normal appearance. She is normal weight. She is not ill-appearing, toxic-appearing or diaphoretic.  HENT:     Head: Normocephalic and atraumatic.     Mouth/Throat:     Mouth: Mucous membranes are moist.     Pharynx: Oropharynx is clear. No oropharyngeal exudate or posterior oropharyngeal erythema.  Eyes:     Extraocular Movements: Extraocular movements intact.     Conjunctiva/sclera: Conjunctivae normal.     Pupils: Pupils are equal, round, and reactive to light.  Cardiovascular:     Rate and Rhythm: Normal rate and regular rhythm.     Pulses: Normal pulses.     Heart sounds: Normal heart sounds. No murmur heard.  No friction rub. No gallop.   Pulmonary:     Effort: Pulmonary effort is normal. No respiratory distress.     Breath sounds: Normal breath sounds. No wheezing, rhonchi or rales.  Abdominal:     General: Bowel sounds are normal.  Musculoskeletal:     Cervical back: Normal range of motion and neck supple.  Lymphadenopathy:     Cervical: No cervical adenopathy.  Skin:    General: Skin is warm and dry.  Neurological:     General: No focal deficit present.     Mental Status: She is alert. Mental status is at baseline.  Psychiatric:        Mood and Affect: Mood normal.        Behavior: Behavior normal.    ASSESSMENT/PLAN:   Fatigue Patient reports fatigue, denies any syncope, dizziness, lightheadedness. Cannot take part in  activities she enjoys like weight lifting, exercise. Previously checked thyroid panels in the last month and CBC, both WNL. Possibly could be related to decreased SSRI, however this has been appropriately decreased over several months. Fatigue no longer lack of motivation, which is more consistent with depression. Recommend patient follow up with her psychiatrist, appt on 09/21/19. It is possible that patient has functional disorder of fatigue, where despite her best efforts, is not able to achieve her goals. Discussed with patient changing expectations to fit her ability, as her ability to do more may not change soon. Can consider CBT. Obtained BMP to assess electrolytes, and ANA, RF to r/o autoimmune disorders. All three WNL, no objective markers of autoimmune problem. Will discuss with patient and recommend CBT and follow up as needed. Recommend patient increase calories daily to cover workouts to meet ~1500 calories per day. Does not need to be in caloric deficit. She denies signs/symptoms of ED, happy with body image at this point in time.     Shirlean Mylar, MD [facility]

## 2020-01-11 ENCOUNTER — Encounter: Payer: Self-pay | Admitting: Family Medicine

## 2020-01-11 DIAGNOSIS — B0089 Other herpesviral infection: Secondary | ICD-10-CM

## 2020-01-11 NOTE — Telephone Encounter (Signed)
Discussed with patient, same as last time, some tingling, no pain, presently several small vesicles on chin. Could be acne vs herpes gladiatorum. Patient to send photo, will discuss with preceptors tomorrow. Patient out of town for holidays, so unable to come in, started Wednesday night, will be less than 72 hours tomorrow as well.  Shirlean Mylar, MD [facility], PGY-2

## 2020-01-12 MED ORDER — VALACYCLOVIR HCL 1 G PO TABS: 1000.0000 mg | ORAL_TABLET | Freq: Three times a day (TID) | ORAL | 1 refills | Status: AC

## 2020-01-12 NOTE — Addendum Note (Signed)
Addended by: Horton Chin on: 01/12/2020 12:30 PM   Modules accepted: Orders

## 2020-01-12 NOTE — Telephone Encounter (Signed)
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 Will treat with valtrex 1g TID x 7 days. Discussed with Dr. Miquel Dunn who agrees. Will monitor for improvement with antiviral tx.  Shirlean Mylar, MD [facility], PGY-2

## 2020-01-12 NOTE — Addendum Note (Signed)
Addended by: Horton Chin on: 01/12/2020 01:44 PM   Modules accepted: Orders

## 2020-02-08 NOTE — Progress Notes (Signed)
SUBJECTIVE:   CHIEF COMPLAINT / HPI:   Shoulders: started may/june - come and go, 'nothing crazy'. Pain feels 'dead center' in the shoulder.    Worse over the past month and especially the past two weeks.  Describes it as a dull aching with intermittent periods of acute sharp pain.  Was leaning on it last night and the pain almost brought her to tears.  Can't sleep on her side any more. Left arm started first and is worse.  Right only has dull ache.  Right arm pain started the past two weeks.  Does not radiate.  Lifting her arms hurt, including putting on clothes.  Icy hot has helped.  biofreeze did not help.  Has tried heating pad.  Has not taken tylenol or other nsaids.  No fevers/chills, no night sweats.  No rashes.   Goes to the gym regularly for the past 1.5 years.  Does upper body exercises twice a week.  Currently does lower weights/higher reps.  Still does upper body exercises but has had to take some things out due to pain.  Used to do upright rows.    She had some soreness in her hips 'for a bit'.  This occurred for less than two months.  Her fatigue is much improved recently and she has even weaned herself off of her antidepressant medication  PERTINENT  PMH / PSH: Fatigue, anxiety and depression  OBJECTIVE:   BP 100/60   Pulse 91   Ht 5' 2" (1.575 m)   Wt 137 lb 6.4 oz (62.3 kg)   SpO2 98%   BMI 25.13 kg/m   General: Alert and oriented.  No acute distress CV: Regular rate and rhythm Pulmonary: Lungs clear to auscultation bilaterally MSK: 5/5 upper body strength in the shoulders, bicep/tricep, grip.  Full active range of motion in the upper extremities although slow due to discomfort.  Mildly tender to palpation over the left medial superior scapula.  Empty can test positive for discomfort but not for weakness bilaterally.  Hawkins test positive for discomfort, right greater than left.  Left crossarm test positive for discomfort.  Negative Speed test.  Negative resisted  external/internal shoulder rotation.  ASSESSMENT/PLAN:   Chronic pain of both shoulders Physical exam and history consistent with bilateral rotator cuff tendinitis, likely due to overuse.  Patient's upper body exercises, especially upright rows, likely be causing most of the inflammation.  Patient has already had basic work-up for autoimmune disorders when she complained about fatigue earlier this year.  There is a small possibility patient may have an inflammatory disorder such as polymyalgia rheumatica, although unlikely given her age.  We will test for ESR and CRP to rule out.  Prescribed naproxen for 2 weeks, recommended complete rest of shoulder exercises, heat.  Will refer to sports medicine.  Patient prefers sports med clinic near [employer] where she goes to school.     Daniel K Olson, MD [facility]   

## 2020-02-09 ENCOUNTER — Encounter: Payer: Self-pay | Admitting: Family Medicine

## 2020-02-09 ENCOUNTER — Ambulatory Visit: Payer: 59 | Admitting: Family Medicine

## 2020-02-09 ENCOUNTER — Other Ambulatory Visit: Payer: Self-pay

## 2020-02-09 VITALS — BP 100/60 | HR 91 | Ht 62.0 in | Wt 137.4 lb

## 2020-02-09 DIAGNOSIS — G8929 Other chronic pain: Secondary | ICD-10-CM | POA: Diagnosis not present

## 2020-02-09 DIAGNOSIS — M25511 Pain in right shoulder: Secondary | ICD-10-CM | POA: Diagnosis not present

## 2020-02-09 DIAGNOSIS — M25512 Pain in left shoulder: Secondary | ICD-10-CM

## 2020-02-09 MED ORDER — NAPROXEN SODIUM 220 MG PO TABS: 220.0000 mg | ORAL_TABLET | Freq: Two times a day (BID) | ORAL | 0 refills | Status: AC

## 2020-02-09 NOTE — Patient Instructions (Addendum)
It was nice to see you today,   Your shoulder pain is likely from rotator cuff tendinopathy caused by overuse.   I would like you to avoid shoulder exercises, use heat, and naproxen twice a day.  I am sending in a referral to sports medicine for further evaluation.    I will let you know the results of your tests when I get them.   Frederic Jericho, MD   Rotator Cuff Tendinitis  Rotator cuff tendinitis is inflammation of the tendons in the rotator cuff. Tendons are tough, cord-like bands that connect muscle to bone. The rotator cuff includes all of the muscles and tendons that connect the arm to the shoulder. The rotator cuff holds the head of the humerus, or the upper arm bone, in the cup of the shoulder blade (scapula). This condition can lead to a long-term or chronic tear. The tear may be partial or complete. What are the causes? This condition is usually caused by overusing the rotator cuff. What increases the risk? This condition is more likely to develop in athletes and workers who frequently use their shoulder or reach over their heads. This can include activities such as:  Tennis.  Baseball or softball.  Swimming.  Construction work.  Painting. What are the signs or symptoms? Symptoms of this condition include:  Pain that spreads (radiates) from the shoulder to the upper arm.  Swelling and tenderness in front of the shoulder.  Pain when reaching, pulling, or lifting the arm above the head.  Pain when lowering the arm from above the head.  Minor pain in the shoulder when resting.  Increased pain in the shoulder at night.  Difficulty placing the arm behind the back. How is this diagnosed? This condition is diagnosed with a physical exam and medical history. Tests may also be done, including:  X-rays.  MRI.  Ultrasound.  CT with or without contrast. How is this treated? Treatment for this condition depends on the severity of the condition. In less severe cases,  treatment may include:  Rest. This may be done with a sling that holds the shoulder still (immobilization). Your health care provider may also recommend avoiding activities that involve lifting your arm over your head.  Icing the shoulder.  Anti-inflammatory medicines, such as aspirin or ibuprofen. In more severe cases, treatment may include:  Physical therapy.  Steroid injections.  Surgery. Follow these instructions at home: If you have a sling:  Wear the sling as told by your health care provider. Remove it only as told by your health care provider.  Loosen it if your fingers tingle, become numb, or turn cold and blue.  Keep it clean.  If the sling is not waterproof: ? Do not let it get wet. ? Cover it with a watertight covering when you take a bath or shower. Managing pain, stiffness, and swelling  If directed, put ice on the injured area. To do this: ? If you have a removable sling, remove it as told by your health care provider. ? Put ice in a plastic bag. ? Place a towel between your skin and the bag. ? Leave the ice on for 20 minutes, 2-3 times a day.  Move your fingers often to reduce stiffness and swelling.  Raise (elevate) the injured area above the level of your heart while you are lying down.  Find a comfortable sleeping position, or sleep in a recliner, if available.   Activity  Rest your shoulder as told by your health care  provider.  Ask your health care provider when it is safe to drive if you have a sling on your arm.  Return to your normal activities as told by your health care provider. Ask your health care provider what activities are safe for you.  Do any exercises or stretches as told by your health care provider or physical therapist.  If you do repetitive overhead tasks, take small breaks in between and include stretching exercises as told by your health care provider. General instructions  Do not use any products that contain nicotine or  tobacco, such as cigarettes, e-cigarettes, and chewing tobacco. These can delay healing. If you need help quitting, ask your health care provider.  Take over-the-counter and prescription medicines only as told by your health care provider.  Keep all follow-up visits as told by your health care provider. This is important. Contact a health care provider if:  Your pain gets worse.  You have new pain in your arm, hands, or fingers.  Your pain is not relieved with medicine or does not get better after 6 weeks of treatment.  You have crackling sensations when moving your shoulder in certain directions.  You hear a snapping sound after using your shoulder, followed by severe pain and weakness. Get help right away if:  Your arm, hand, or fingers are numb or tingling.  Your arm, hand, or fingers are swollen or painful or they turn white or blue. Summary  Rotator cuff tendinitis is inflammation of the tendons in the rotator cuff. Tendons are tough, cord-like bands that connect muscle to bone.  This condition is usually caused by overusing the rotator cuff, which includes all of the muscles and tendons that connect the arm to the shoulder.  This condition is more likely to develop in athletes and workers who frequently use their shoulder or reach over their heads.  Treatment generally includes rest, anti-inflammatory medicines, and icing. In some cases, physical therapy and steroid injections may be needed. In severe cases, surgery may be needed. This information is not intended to replace advice given to you by your health care provider. Make sure you discuss any questions you have with your health care provider. Document Revised: 10/17/2018 Document Reviewed: 10/17/2018 Elsevier Patient Education  2021 [employer].

## 2020-02-10 LAB — C-REACTIVE PROTEIN: CRP: <1 mg/L (ref 0–10)

## 2020-02-10 LAB — SEDIMENTATION RATE: Sed Rate: 2 mm/hr (ref 0–32)

## 2020-02-11 DIAGNOSIS — M25512 Pain in left shoulder: Secondary | ICD-10-CM | POA: Insufficient documentation

## 2020-02-11 DIAGNOSIS — G8929 Other chronic pain: Secondary | ICD-10-CM | POA: Insufficient documentation

## 2020-02-11 NOTE — Assessment & Plan Note (Signed)
Physical exam and history consistent with bilateral rotator cuff tendinitis, likely due to overuse.  Patient's upper body exercises, especially upright rows, likely be causing most of the inflammation.  Patient has already had basic work-up for autoimmune disorders when she complained about fatigue earlier this year.  There is a small possibility patient may have an inflammatory disorder such as polymyalgia rheumatica, although unlikely given her age.  We will test for ESR and CRP to rule out.  Prescribed naproxen for 2 weeks, recommended complete rest of shoulder exercises, heat.  Will refer to sports medicine.  Patient prefers sports med clinic near [facility] where she goes to school.

## 2020-02-15 ENCOUNTER — Encounter: Payer: Self-pay | Admitting: Family Medicine

## 2020-08-20 NOTE — Progress Notes (Signed)
    SUBJECTIVE:   CHIEF COMPLAINT / HPI: IUD check  IUD check: 24yo patient presents with complaints of intense abdominal cramping since Sunday (4 days) with spotting. Patient has had an IUD for almost 5 years, it is due to be removed in May 2023. Overall she has not had problems with cramping or spotting since placement, but in the last few months she has had unscheduled bleeding that is very disruptive for her and most recently has had intense cramping that is like when the IUD was first placed. She took two home pregnancy tests that were negative. She is not constipated, regular qod BM. Patient specifically would like the IUD to be removed because she thinks as it has aged, the decreased eluted hormones are causing the cramping and unscheduled bleeding. Discussed other birth control options in depth. At this time, patient reports that she wants time to consider her options and does not want to start hormonal birth control today. She expresses understanding that she can become pregnant as soon as the IUD is removed. Denies fever, chills, CP, SOB, n/v/d, rashes. Pt declines STI testing.  PERTINENT  PMH / PSH: non contributory  OBJECTIVE:   BP (!) 86/60   Ht 5\' 2"  (1.575 m)   Wt 135 lb (61.2 kg)   LMP 07/23/2020   SpO2 99%   BMI 24.69 kg/m   Nursing note and vitals reviewed GEN: young WW, resting comfortably in chair, NAD, WNWD, alert and at baseline Abdomen: Normoactive bowel sounds. No tenderness to deep or light palpation. No rebound or guarding.  No HSM.  PELVIC:  Normal appearing external female genitalia, normal vaginal epithelium, no abnormal discharge, dark brown blood present in vaginal vault. Normal appearing cervix with IUD strings visible. Ext: no edema Psych: Pleasant and appropriate   ASSESSMENT/PLAN:   Intermenstrual spotting due to intrauterine device (IUD) (HCC) Likely due to decreased hormone level in [place]. Patient wants IUD removed, expresses clear understanding  that by removing IUD, she could potentially become pregnant at any time. Discussed possible STIs, she declines testing at this time. Patient is uncertain about which birth control method she wishes to use at this time, discussed in depth. If she decides on one, she will message me and I am happy to prescribe her chosen product. No history of migraine with aura, no h/o DVT, does not smoke, BP normotensive. If patient has continued lower pelvic cramping, I recommend she returns for evaluation and check for STIs.  IUD removal: Patient signed consent and timeout performed. Patient placed in lithotomy position, cervix visualized with speculum, IUD strings easily visible. Sterile ring-forceps used to swab cervix with iodine x3. IUD strings grasped with ring forceps and easily removed.     [place], MD [facility]

## 2020-08-21 ENCOUNTER — Ambulatory Visit: Payer: 59 | Admitting: Family Medicine

## 2020-08-21 ENCOUNTER — Other Ambulatory Visit: Payer: Self-pay

## 2020-08-21 DIAGNOSIS — N923 Ovulation bleeding: Secondary | ICD-10-CM | POA: Diagnosis not present

## 2020-08-21 DIAGNOSIS — T8383XA Hemorrhage of genitourinary prosthetic devices, implants and grafts, initial encounter: Secondary | ICD-10-CM | POA: Diagnosis not present

## 2020-08-21 DIAGNOSIS — IMO0001 Reserved for inherently not codable concepts without codable children: Secondary | ICD-10-CM

## 2020-08-21 NOTE — Patient Instructions (Signed)
It was a pleasure to see you today!  I removed your IUD today. If you continue having cramping and abdominal pain in 7-10 days, return for evaluation.  If you decide on a birth control option that you would like to try instead, message me and I'm happy to order it for you!   Be Well,  Dr. Leary Roca

## 2020-08-22 DIAGNOSIS — N923 Ovulation bleeding: Secondary | ICD-10-CM | POA: Insufficient documentation

## 2020-08-22 DIAGNOSIS — N920 Excessive and frequent menstruation with regular cycle: Secondary | ICD-10-CM | POA: Insufficient documentation

## 2020-08-22 DIAGNOSIS — Z975 Presence of (intrauterine) contraceptive device: Secondary | ICD-10-CM | POA: Insufficient documentation

## 2020-08-22 NOTE — Assessment & Plan Note (Signed)
Likely due to decreased hormone level in [place]. Patient wants IUD removed, expresses clear understanding that by removing IUD, she could potentially become pregnant at any time. Discussed possible STIs, she declines testing at this time. Patient is uncertain about which birth control method she wishes to use at this time, discussed in depth. If she decides on one, she will message me and I am happy to prescribe her chosen product. No history of migraine with aura, no h/o DVT, does not smoke, BP normotensive. If patient has continued lower pelvic cramping, I recommend she returns for evaluation and check for STIs.  IUD removal: Patient signed consent and timeout performed. Patient placed in lithotomy position, cervix visualized with speculum, IUD strings easily visible. Sterile ring-forceps used to swab cervix with iodine x3. IUD strings grasped with ring forceps and easily removed.

## 2020-11-05 ENCOUNTER — Encounter: Payer: Self-pay | Admitting: Family Medicine

## 2020-11-14 ENCOUNTER — Other Ambulatory Visit: Payer: Self-pay | Admitting: Family Medicine

## 2020-11-14 DIAGNOSIS — Z3009 Encounter for other general counseling and advice on contraception: Secondary | ICD-10-CM

## 2020-11-14 MED ORDER — ETONOGESTREL-ETHINYL ESTRADIOL 0.12-0.015 MG/24HR VA RING: VAGINAL_RING | VAGINAL | 4 refills | Status: AC

## 2021-05-02 ENCOUNTER — Encounter: Payer: Self-pay | Admitting: Family Medicine

## 2021-05-05 ENCOUNTER — Encounter: Payer: Self-pay | Admitting: Family Medicine

## 2021-05-05 ENCOUNTER — Other Ambulatory Visit: Payer: Self-pay | Admitting: Family Medicine

## 2021-05-05 NOTE — Progress Notes (Signed)
Patient requested rx for natural cycles for insurance reimbursement. Attempting to order, emailed company, awaiting reply. ?

## 2021-07-01 ENCOUNTER — Encounter: Payer: Self-pay | Admitting: Family Medicine

## 2021-07-01 ENCOUNTER — Encounter: Payer: Self-pay | Admitting: *Deleted

## 2021-07-02 ENCOUNTER — Other Ambulatory Visit: Payer: Self-pay | Admitting: Family Medicine

## 2021-07-02 DIAGNOSIS — Z308 Encounter for other contraceptive management: Secondary | ICD-10-CM

## 2021-07-02 NOTE — Progress Notes (Signed)
Patient requesting prescription for Natural Cycles birth control, which is appropriate.  Shirlean Mylar, MD [facility], PGY-3

## 2021-07-03 ENCOUNTER — Encounter: Payer: Self-pay | Admitting: Family Medicine

## 2021-07-04 ENCOUNTER — Other Ambulatory Visit: Payer: Self-pay | Admitting: Family Medicine

## 2021-07-04 DIAGNOSIS — Z3009 Encounter for other general counseling and advice on contraception: Secondary | ICD-10-CM

## 2021-07-08 ENCOUNTER — Encounter: Payer: Self-pay | Admitting: Family Medicine
